# Patient Record
Sex: Female | Born: 1980 | Race: Black or African American | Hispanic: No | Marital: Married | Smoking: Former smoker
Health system: Southern US, Community
[De-identification: ages and names within clinical notes are randomized; demographics above are authoritative.]

## PROBLEM LIST (undated history)

## (undated) DIAGNOSIS — E079 Disorder of thyroid, unspecified: Secondary | ICD-10-CM

## (undated) DIAGNOSIS — E669 Obesity, unspecified: Secondary | ICD-10-CM

## (undated) DIAGNOSIS — I499 Cardiac arrhythmia, unspecified: Secondary | ICD-10-CM

## (undated) HISTORY — PX: BALLOON ANGIOPLASTY, ARTERY: SHX564

## (undated) HISTORY — PX: ANKLE FRACTURE SURGERY: SHX122

---

## 2008-07-22 ENCOUNTER — Ambulatory Visit: Payer: Self-pay | Admitting: Obstetrics & Gynecology

## 2008-07-22 ENCOUNTER — Inpatient Hospital Stay (HOSPITAL_COMMUNITY): Admission: AD | Admit: 2008-07-22 | Discharge: 2008-07-22 | Payer: Self-pay | Admitting: Obstetrics & Gynecology

## 2011-07-20 LAB — DIFFERENTIAL
Band Neutrophils: 7
Blasts: 0
Eosinophils Absolute: 0.1
Metamyelocytes Relative: 0
Myelocytes: 0
nRBC: 0

## 2011-07-20 LAB — CBC
HCT: 35.8 — ABNORMAL LOW
MCHC: 33.8
MCV: 93.7
Platelets: 206
RDW: 13.5

## 2013-10-02 ENCOUNTER — Emergency Department (HOSPITAL_BASED_OUTPATIENT_CLINIC_OR_DEPARTMENT_OTHER): Payer: Self-pay

## 2013-10-02 ENCOUNTER — Emergency Department (HOSPITAL_BASED_OUTPATIENT_CLINIC_OR_DEPARTMENT_OTHER)
Admission: EM | Admit: 2013-10-02 | Discharge: 2013-10-02 | Disposition: A | Discharge status: Home / Self Care | Payer: Self-pay | Attending: Emergency Medicine | Admitting: Emergency Medicine

## 2013-10-02 ENCOUNTER — Encounter (HOSPITAL_BASED_OUTPATIENT_CLINIC_OR_DEPARTMENT_OTHER): Payer: Self-pay | Admitting: Emergency Medicine

## 2013-10-02 DIAGNOSIS — R0602 Shortness of breath: Secondary | ICD-10-CM | POA: Insufficient documentation

## 2013-10-02 DIAGNOSIS — Z8679 Personal history of other diseases of the circulatory system: Secondary | ICD-10-CM | POA: Insufficient documentation

## 2013-10-02 DIAGNOSIS — R002 Palpitations: Secondary | ICD-10-CM | POA: Insufficient documentation

## 2013-10-02 HISTORY — DX: Cardiac arrhythmia, unspecified: I49.9

## 2013-10-02 LAB — CBC WITH DIFFERENTIAL/PLATELET
Basophils Absolute: 0 10*3/uL (ref 0.0–0.1)
Eosinophils Relative: 1 % (ref 0–5)
HCT: 36.2 % (ref 36.0–46.0)
Lymphocytes Relative: 36 % (ref 12–46)
Lymphs Abs: 3.1 10*3/uL (ref 0.7–4.0)
MCV: 86.2 fL (ref 78.0–100.0)
Neutro Abs: 4.8 10*3/uL (ref 1.7–7.7)
Platelets: 307 10*3/uL (ref 150–400)
RBC: 4.2 MIL/uL (ref 3.87–5.11)
WBC: 8.5 10*3/uL (ref 4.0–10.5)

## 2013-10-02 LAB — TROPONIN I: Troponin I: <0.3 ng/mL (ref <0.30)

## 2013-10-02 LAB — BASIC METABOLIC PANEL
CO2: 24 mEq/L (ref 19–32)
Chloride: 104 mEq/L (ref 96–112)
Creatinine, Ser: 0.8 mg/dL (ref 0.50–1.10)
GFR calc Af Amer: >90 mL/min (ref >90)
Potassium: 3.9 mEq/L (ref 3.5–5.1)

## 2013-10-02 NOTE — ED Notes (Signed)
Intermittent mid sternal cp x3 days she describes as "pressure".  Denies radiation. Denies n/v. Denies back pain.

## 2013-10-02 NOTE — ED Notes (Signed)
MD at bedside discussing test results and dispo plan of care. 

## 2013-10-02 NOTE — ED Notes (Signed)
Patient transported to X-ray 

## 2013-10-02 NOTE — ED Notes (Signed)
Chest pressure and pain x days, denies n/v  No radiation,  No change w movement

## 2013-10-02 NOTE — ED Provider Notes (Signed)
CSN: [id number]     Arrival date & time 10/02/13  2059 History  This chart was scribed for Rolan Bucco, MD by Landis Gandy, ED Scribe. This patient was seen in room MH11/MH11 and the patient's care was started at 9:45 PM   Chief Complaint  Patient presents with  . Chest Pain   The history is provided by the patient. No language interpreter was used.   HPI Comments: Candace Cohen is a 32 y.o. female who presents to the Emergency Department complaining of intermittent chest pain, onset three days ago. Pt describes the pain as "pressure" and says she feels like her heart is racing. She states that she has a history of palpitations, that have worsened over the past few days.  Pt states that the episodes usually last about twenty minutes. She reports associated symptoms of SOB.  She has a history of balloon angioplasty surgery at age 34 to correct an irregular heartbeat. She denies any LOC. She denies associated syncope/near syncope with palpitations.    Past Medical History  Diagnosis Date  . Irregular heart beat    Past Surgical History  Procedure Laterality Date  . Balloon angioplasty, artery      At age 62   No family history on file. History  Substance Use Topics  . Smoking status: Former Games developer  . Smokeless tobacco: Not on file  . Alcohol Use: No   OB History   Grav Para Term Preterm Abortions TAB SAB Ect Mult Living                 Review of Systems  Constitutional: Negative for fever, chills, diaphoresis and fatigue.  HENT: Negative for congestion, rhinorrhea and sneezing.   Eyes: Negative.   Respiratory: Positive for shortness of breath. Negative for cough and chest tightness.   Cardiovascular: Positive for chest pain and palpitations. Negative for leg swelling.  Gastrointestinal: Negative for nausea, vomiting, abdominal pain, diarrhea and blood in stool.  Genitourinary: Negative for frequency, hematuria, flank pain and difficulty urinating.  Musculoskeletal:  Negative for arthralgias and back pain.  Skin: Negative for rash.  Neurological: Negative for dizziness (Loss of balance), syncope, speech difficulty, weakness, numbness and headaches.    Allergies  Review of patient's allergies indicates no known allergies.  Home Medications  No current outpatient prescriptions on file. Triage Vitals: BP 139/80  Pulse 74  Temp(Src) 98.4 F (36.9 C) (Oral)  Resp 16  Ht 5' 4.5" (1.638 m)  Wt 226 lb (102.513 kg)  BMI 38.21 kg/m2  SpO2 100% Physical Exam  Constitutional: She is oriented to person, place, and time. She appears well-developed and well-nourished.  HENT:  Head: Normocephalic and atraumatic.  Eyes: Pupils are equal, round, and reactive to light.  Neck: Normal range of motion. Neck supple.  Cardiovascular: Normal rate, regular rhythm and normal heart sounds.   Pulmonary/Chest: Effort normal and breath sounds normal. No respiratory distress. She has no wheezes. She has no rales. She exhibits no tenderness.  Abdominal: Soft. Bowel sounds are normal. There is no tenderness. There is no rebound and no guarding.  Musculoskeletal: Normal range of motion. She exhibits no edema.  No calf tenderness  Lymphadenopathy:    She has no cervical adenopathy.  Neurological: She is alert and oriented to person, place, and time.  Skin: Skin is warm and dry. No rash noted.  Psychiatric: She has a normal mood and affect.    ED Course  Procedures (including critical care time) DIAGNOSTIC STUDIES: Oxygen Saturation is  100% on RA, normal by my interpretation.    COORDINATION OF CARE: 9:50 PM- Discussed plan to obtain CXR, EKG and diagnostic labs. Also discussed options for follow-up with cardiology. Pt advised of plan for treatment and pt agrees.  Labs Review Results for orders placed during the hospital encounter of 10/02/13  CBC WITH DIFFERENTIAL      Result Value Range   WBC 8.5  4.0 - 10.5 K/uL   RBC 4.20  3.87 - 5.11 MIL/uL   Hemoglobin 11.8  (*) 12.0 - 15.0 g/dL   HCT 16.1  09.6 - 04.5 %   MCV 86.2  78.0 - 100.0 fL   MCH 28.1  26.0 - 34.0 pg   MCHC 32.6  30.0 - 36.0 g/dL   RDW 40.9  81.1 - 91.4 %   Platelets 307  150 - 400 K/uL   Neutrophils Relative % 56  43 - 77 %   Neutro Abs 4.8  1.7 - 7.7 K/uL   Lymphocytes Relative 36  12 - 46 %   Lymphs Abs 3.1  0.7 - 4.0 K/uL   Monocytes Relative 7  3 - 12 %   Monocytes Absolute 0.6  0.1 - 1.0 K/uL   Eosinophils Relative 1  0 - 5 %   Eosinophils Absolute 0.1  0.0 - 0.7 K/uL   Basophils Relative 1  0 - 1 %   Basophils Absolute 0.0  0.0 - 0.1 K/uL  TROPONIN I      Result Value Range   Troponin I <0.30  <0.30 ng/mL  BASIC METABOLIC PANEL      Result Value Range   Sodium 139  135 - 145 mEq/L   Potassium 3.9  3.5 - 5.1 mEq/L   Chloride 104  96 - 112 mEq/L   CO2 24  19 - 32 mEq/L   Glucose, Bld 91  70 - 99 mg/dL   BUN 8  6 - 23 mg/dL   Creatinine, Ser 7.82  0.50 - 1.10 mg/dL   Calcium 9.0  8.4 - 95.6 mg/dL   GFR calc non Af Amer >90  >90 mL/min   GFR calc Af Amer >90  >90 mL/min   Dg Chest 2 View  10/02/2013   CLINICAL DATA:  Chest pain and tachycardia  EXAM: CHEST  2 VIEW  COMPARISON:  None.  FINDINGS: The heart size and mediastinal contours are within normal limits. Both lungs are clear. The visualized skeletal structures are unremarkable.  IMPRESSION: No active cardiopulmonary disease.   Electronically Signed   By: Alcide Clever M.D.   On: 10/02/2013 21:57    Imaging Review Dg Chest 2 View  10/02/2013   CLINICAL DATA:  Chest pain and tachycardia  EXAM: CHEST  2 VIEW  COMPARISON:  None.  FINDINGS: The heart size and mediastinal contours are within normal limits. Both lungs are clear. The visualized skeletal structures are unremarkable.  IMPRESSION: No active cardiopulmonary disease.   Electronically Signed   By: Alcide Clever M.D.   On: 10/02/2013 21:57    EKG Interpretation    Date/Time:  Tuesday October 02 2013 21:08:06 EST Ventricular Rate:  75 PR Interval:  138 QRS  Duration: 66 QT Interval:  366 QTC Calculation: 408 R Axis:   64 Text Interpretation:  Normal sinus rhythm Normal ECG No old tracing to compare Confirmed by Kalmen Lollar  MD, Tiffane Sheldon ([id number]) on 10/02/2013 10:13:09 PM            MDM   1. Palpitations  Pt with palpitations, associated with chest pressure and SOB.  Has been having palpitations for several months, but more frequent last few days.  No CP without palpitations.  EKG ok.  Will refer for close outpatient f/u with cardiology  I personally performed the services described in this documentation, which was scribed in my presence.  The recorded information has been reviewed and considered.     Rolan Bucco, MD 10/02/13 2213

## 2015-08-01 ENCOUNTER — Encounter (HOSPITAL_BASED_OUTPATIENT_CLINIC_OR_DEPARTMENT_OTHER): Payer: Self-pay | Admitting: Emergency Medicine

## 2015-08-01 ENCOUNTER — Emergency Department (HOSPITAL_BASED_OUTPATIENT_CLINIC_OR_DEPARTMENT_OTHER)
Admission: EM | Admit: 2015-08-01 | Discharge: 2015-08-01 | Disposition: A | Discharge status: Home / Self Care | Payer: Self-pay | Attending: Emergency Medicine | Admitting: Emergency Medicine

## 2015-08-01 ENCOUNTER — Emergency Department (HOSPITAL_BASED_OUTPATIENT_CLINIC_OR_DEPARTMENT_OTHER): Payer: Self-pay

## 2015-08-01 DIAGNOSIS — E669 Obesity, unspecified: Secondary | ICD-10-CM | POA: Insufficient documentation

## 2015-08-01 DIAGNOSIS — Z87891 Personal history of nicotine dependence: Secondary | ICD-10-CM | POA: Insufficient documentation

## 2015-08-01 DIAGNOSIS — Z8679 Personal history of other diseases of the circulatory system: Secondary | ICD-10-CM | POA: Insufficient documentation

## 2015-08-01 DIAGNOSIS — M25571 Pain in right ankle and joints of right foot: Secondary | ICD-10-CM | POA: Insufficient documentation

## 2015-08-01 HISTORY — DX: Obesity, unspecified: E66.9

## 2015-08-01 MED ORDER — DEXAMETHASONE 4 MG PO TABS: 4.0000 mg | ORAL_TABLET | Freq: Two times a day (BID) | ORAL | Status: AC

## 2015-08-01 MED ORDER — PREDNISONE 50 MG PO TABS
60.0000 mg | ORAL_TABLET | Freq: Once | ORAL | Status: AC
  Administered 2015-08-01: 60 mg via ORAL
  Filled 2015-08-01 (×2): qty 1

## 2015-08-01 MED ORDER — KETOROLAC TROMETHAMINE 10 MG PO TABS
10.0000 mg | ORAL_TABLET | Freq: Once | ORAL | Status: AC
  Administered 2015-08-01: 10 mg via ORAL
  Filled 2015-08-01: qty 1

## 2015-08-01 MED ORDER — MELOXICAM 7.5 MG PO TABS: 7.5000 mg | ORAL_TABLET | Freq: Two times a day (BID) | ORAL | Status: AC

## 2015-08-01 NOTE — ED Notes (Signed)
Pain in right ankle x6 months.  Has not seen pmd. Walks with brisk gait.

## 2015-08-01 NOTE — Discharge Instructions (Signed)
Your x-ray is negative for fracture or dislocation. Your examination favors inflammation involving the right ankle. Please see Dr. Magnus IvanBlackman or the orthopedic specialist of your choice for evaluation concerning your ankle. Continue to use your compression sock. Use of mobile 2 times daily, and Decadron 2 times daily with food. Ankle Pain Ankle pain is a common symptom. The bones, cartilage, tendons, and muscles of the ankle joint perform a lot of work each day. The ankle joint holds your body weight and allows you to move around. Ankle pain can occur on either side or back of 1 or both ankles. Ankle pain may be sharp and burning or dull and aching. There may be tenderness, stiffness, redness, or warmth around the ankle. The pain occurs more often when a person walks or puts pressure on the ankle. CAUSES  There are many reasons ankle pain can develop. It is important to work with your caregiver to identify the cause since many conditions can impact the bones, cartilage, muscles, and tendons. Causes for ankle pain include:  Injury, including a break (fracture), sprain, or strain often due to a fall, sports, or a high-impact activity.  Swelling (inflammation) of a tendon (tendonitis).  Achilles tendon rupture.  Ankle instability after repeated sprains and strains.  Poor foot alignment.  Pressure on a nerve (tarsal tunnel syndrome).  Arthritis in the ankle or the lining of the ankle.  Crystal formation in the ankle (gout or pseudogout). DIAGNOSIS  A diagnosis is based on your medical history, your symptoms, results of your physical exam, and results of diagnostic tests. Diagnostic tests may include X-ray exams or a computerized magnetic scan (magnetic resonance imaging, MRI). TREATMENT  Treatment will depend on the cause of your ankle pain and may include:  Keeping pressure off the ankle and limiting activities.  Using crutches or other walking support (a cane or brace).  Using rest, ice,  compression, and elevation.  Participating in physical therapy or home exercises.  Wearing shoe inserts or special shoes.  Losing weight.  Taking medications to reduce pain or swelling or receiving an injection.  Undergoing surgery. HOME CARE INSTRUCTIONS   Only take over-the-counter or prescription medicines for pain, discomfort, or fever as directed by your caregiver.  Put ice on the injured area.  Put ice in a plastic bag.  Place a towel between your skin and the bag.  Leave the ice on for 15-20 minutes at a time, 03-04 times a day.  Keep your leg raised (elevated) when possible to lessen swelling.  Avoid activities that cause ankle pain.  Follow specific exercises as directed by your caregiver.  Record how often you have ankle pain, the location of the pain, and what it feels like. This information may be helpful to you and your caregiver.  Ask your caregiver about returning to work or sports and whether you should drive.  Follow up with your caregiver for further examination, therapy, or testing as directed. SEEK MEDICAL CARE IF:   Pain or swelling continues or worsens beyond 1 week.  You have an oral temperature above 102 F (38.9 C).  You are feeling unwell or have chills.  You are having an increasingly difficult time with walking.  You have loss of sensation or other new symptoms.  You have questions or concerns. MAKE SURE YOU:   Understand these instructions.  Will watch your condition.  Will get help right away if you are not doing well or get worse.   This information is not intended  to replace advice given to you by your health care provider. Make sure you discuss any questions you have with your health care provider.   Document Released: 03/24/2010 Document Revised: 12/27/2011 Document Reviewed: 05/06/2015 Elsevier Interactive Patient Education [employer].

## 2015-08-01 NOTE — ED Provider Notes (Signed)
CSN: [id number]     Arrival date & time 08/01/15  1949 History   First MD Initiated Contact with Patient 08/01/15 1957     Chief Complaint  Patient presents with  . Ankle Pain     (Consider location/radiation/quality/duration/timing/severity/associated sxs/prior Treatment) Patient is a 34 y.o. female presenting with ankle pain. The history is provided by the patient.  Ankle Pain Location:  Ankle Injury: no   Ankle location:  R ankle Pain details:    Quality:  Aching and shooting   Severity:  Moderate   Onset quality:  Gradual   Duration:  6 months   Timing:  Intermittent   Progression:  Worsening Chronicity:  Chronic Prior injury to area:  Yes Relieved by:  Nothing Worsened by:  Bearing weight Ineffective treatments:  Acetaminophen Associated symptoms: no fever, no numbness and no tingling   Risk factors: no known bone disorder     Past Medical History  Diagnosis Date  . Irregular heart beat   . Obesity    Past Surgical History  Procedure Laterality Date  . Balloon angioplasty, artery      At age 89  . Ankle fracture surgery     No family history on file. Social History  Substance Use Topics  . Smoking status: Former Games developermoker  . Smokeless tobacco: None  . Alcohol Use: No   OB History    No data available     Review of Systems  Constitutional: Negative for fever.  Musculoskeletal: Positive for arthralgias.  All other systems reviewed and are negative.     Allergies  Review of patient's allergies indicates no known allergies.  Home Medications   Prior to Admission medications   Medication Sig Start Date End Date Taking? Authorizing Provider  dexamethasone (DECADRON) 4 MG tablet Take 1 tablet (4 mg total) by mouth 2 (two) times daily with a meal. 08/01/15   Ivery QualeHobson Marge Vandermeulen, PA-C  meloxicam (MOBIC) 7.5 MG tablet Take 1 tablet (7.5 mg total) by mouth 2 (two) times daily. 08/01/15   Ivery QualeHobson Marytza Grandpre, PA-C   BP 130/83 mmHg  Pulse 70  Temp(Src) 98.5 F (36.9  C) (Oral)  Resp 16  Ht 5\' 5"  (1.651 m)  Wt 223 lb (101.152 kg)  BMI 37.11 kg/m2  SpO2 100% Physical Exam  Constitutional: She is oriented to person, place, and time. She appears well-developed and well-nourished.  Non-toxic appearance.  HENT:  Head: Normocephalic.  Right Ear: Tympanic membrane and external ear normal.  Left Ear: Tympanic membrane and external ear normal.  Eyes: EOM and lids are normal. Pupils are equal, round, and reactive to light.  Neck: Normal range of motion. Neck supple. Carotid bruit is not present.  Cardiovascular: Normal rate, regular rhythm, normal heart sounds, intact distal pulses and normal pulses.   Pulmonary/Chest: Breath sounds normal. No respiratory distress.  Abdominal: Soft. Bowel sounds are normal. There is no tenderness. There is no guarding.  Musculoskeletal: Normal range of motion.  There is puffiness and tenderness of the lateral malleolus on the right. The dorsalis pedis and posterior tibial pulses are 2+. The Achilles tendon is intact. There no lesions between the toes. There no puncture wounds of the plantar surface of the right foot. There is full range of motion of the toes, knee, and hip of the right lower extremity. There no temperature changes or discolorations appreciated.  Lymphadenopathy:       Head (right side): No submandibular adenopathy present.       Head (left side): No  submandibular adenopathy present.    She has no cervical adenopathy.  Neurological: She is alert and oriented to person, place, and time. She has normal strength. No cranial nerve deficit or sensory deficit.  Skin: Skin is warm and dry.  Psychiatric: She has a normal mood and affect. Her speech is normal.  Nursing note and vitals reviewed.   ED Course  Procedures (including critical care time) Labs Review Labs Reviewed - No data to display  Imaging Review Dg Ankle Complete Right  08/01/2015  CLINICAL DATA:  Lateral ankle pain for 1 year without trauma.  EXAM: RIGHT ANKLE - COMPLETE 3+ VIEW COMPARISON:  None. FINDINGS: No acute fracture or dislocation. Base of fifth metatarsal and talar dome intact. Mild pes planus deformity. Tiny calcaneal spur. IMPRESSION: No acute osseous abnormality. Electronically Signed   By: Jeronimo Greaves M.D.   On: 08/01/2015 20:58   I have personally reviewed and evaluated these images and lab results as part of my medical decision-making.   EKG Interpretation None      MDM  Vital signs stable.  Exam suggest chronic ankle pain. No acute changes noted on exam. Mild degenerative changes on xray, but no fx or dislocation. Pt to follow up with podiatry to complete the work up and for management. Rx for decadron and mobic given to the patient.   Final diagnoses:  Ankle pain, right    *I have reviewed nursing notes, vital signs, and all appropriate lab and imaging results for this patient.[address], PA-C 08/05/15 2008  Rolan Bucco, MD 08/06/15 2121

## 2016-09-21 ENCOUNTER — Emergency Department (HOSPITAL_BASED_OUTPATIENT_CLINIC_OR_DEPARTMENT_OTHER)
Admission: EM | Admit: 2016-09-21 | Discharge: 2016-09-22 | Disposition: A | Discharge status: Home / Self Care | Payer: Medicaid Other | Attending: Emergency Medicine | Admitting: Emergency Medicine

## 2016-09-21 ENCOUNTER — Encounter (HOSPITAL_BASED_OUTPATIENT_CLINIC_OR_DEPARTMENT_OTHER): Payer: Self-pay | Admitting: Emergency Medicine

## 2016-09-21 DIAGNOSIS — M25571 Pain in right ankle and joints of right foot: Secondary | ICD-10-CM | POA: Diagnosis present

## 2016-09-21 DIAGNOSIS — M722 Plantar fascial fibromatosis: Secondary | ICD-10-CM | POA: Insufficient documentation

## 2016-09-21 DIAGNOSIS — Z87891 Personal history of nicotine dependence: Secondary | ICD-10-CM | POA: Diagnosis not present

## 2016-09-21 NOTE — ED Triage Notes (Signed)
Patient states that she has had pain and swelling to her right ankle x 3 weeks. Reports no difference tonight except the pain is getting worse

## 2016-09-22 MED ORDER — IBUPROFEN 400 MG PO TABS
600.0000 mg | ORAL_TABLET | Freq: Once | ORAL | Status: AC
  Administered 2016-09-22: 600 mg via ORAL
  Filled 2016-09-22: qty 1

## 2016-09-22 NOTE — ED Provider Notes (Signed)
[facility]   CSN: [id number] Arrival date & time: 09/21/16  2337     History   Chief Complaint Chief Complaint  Patient presents with  . Ankle Pain    HPI Candace Cohen is a 35 y.o. female. With no sig PMH, here with ankle pain x 6 months.  She states the swelling has been there but the pain has worsened.  She denies any trauma to the area.  Swelling is diffuse over the R ankle, but pain is on the medial aspect and runs into the plantar surface.  She works at [employer] and is on her feet for 12 hours a day.  She states her shoes do not have the best support and she does not have insoles.   She is requesting something to help with pain and swelling.  There are no further complaint and she states her other ankle and foot are normal.  10 Systems reviewed and are negative for acute change except as noted in the HPI.  HPI  Past Medical History:  Diagnosis Date  . Irregular heart beat   . Obesity     There are no active problems to display for this patient.   Past Surgical History:  Procedure Laterality Date  . ANKLE FRACTURE SURGERY    . BALLOON ANGIOPLASTY, ARTERY     At age 35    OB History    No data available       Home Medications    Prior to Admission medications   Medication Sig Start Date End Date Taking? Authorizing Provider  dexamethasone (DECADRON) 4 MG tablet Take 1 tablet (4 mg total) by mouth 2 (two) times daily with a meal. 08/01/15   Ivery QualeHobson Bryant, PA-C  meloxicam (MOBIC) 7.5 MG tablet Take 1 tablet (7.5 mg total) by mouth 2 (two) times daily. 08/01/15   Ivery QualeHobson Bryant, PA-C    Family History History reviewed. No pertinent family history.  Social History Social History  Substance Use Topics  . Smoking status: Former Games developermoker  . Smokeless tobacco: Never Used  . Alcohol use No     Allergies   Patient has no known allergies.   Review of Systems Review of Systems   Physical Exam Updated Vital Signs BP 119/78    Pulse 72   Temp 98.5 F (36.9 C) (Oral)   Resp 18   Ht 5\' 5"  (1.651 m)   Wt 220 lb (99.8 kg)   LMP 09/21/2016   SpO2 97%   BMI 36.61 kg/m   Physical Exam  Constitutional: She is oriented to person, place, and time. She appears well-developed and well-nourished. No distress.  HENT:  Head: Normocephalic and atraumatic.  Nose: Nose normal.  Mouth/Throat: Oropharynx is clear and moist. No oropharyngeal exudate.  Eyes: Conjunctivae and EOM are normal. Pupils are equal, round, and reactive to light. No scleral icterus.  Neck: Normal range of motion. Neck supple. No JVD present. No tracheal deviation present. No thyromegaly present.  Cardiovascular: Normal rate, regular rhythm and normal heart sounds.  Exam reveals no gallop and no friction rub.   No murmur heard. Pulmonary/Chest: Effort normal and breath sounds normal. No respiratory distress. She has no wheezes. She exhibits no tenderness.  Abdominal: Soft. Bowel sounds are normal. She exhibits no distension and no mass. There is no tenderness. There is no rebound and no guarding.  Musculoskeletal: Normal range of motion. She exhibits no edema or tenderness.  Minimal swelling seen to right ankle.  There is  TTP of the medial aspect and plantar surface.  No erythema or warmth.  Lymphadenopathy:    She has no cervical adenopathy.  Neurological: She is alert and oriented to person, place, and time. No cranial nerve deficit. She exhibits normal muscle tone.  Skin: Skin is warm and dry. No rash noted. No erythema. No pallor.  Nursing note and vitals reviewed.    ED Treatments / Results  Labs (all labs ordered are listed, but only abnormal results are displayed) Labs Reviewed - No data to display  EKG  EKG Interpretation None       Radiology No results found.  Procedures Procedures (including critical care time)  Medications Ordered in ED Medications  ibuprofen (ADVIL,MOTRIN) tablet 600 mg (600 mg Oral Given 09/22/16 0112)      Initial Impression / Assessment and Plan / ED Course  I have reviewed the triage vital signs and the nursing notes.  Pertinent labs & imaging results that were available during my care of the patient were reviewed by me and considered in my medical decision making (see chart for details).  Clinical Course    Patient presents to the ED for foot and ankle pain.  Her exam is consistent with plantar fascitis.  Education and preventative measures were provided.  She was given postop shoe to see if a hard surface may help.  Advised to get insoles or new shoes as well.  Ibuprofen for pain control. PCP fu advised or podiatry.  She appears well and in NAD. VS remain within her normal limits and she is safe for DC.  Final Clinical Impressions(s) / ED Diagnoses   Final diagnoses:  Plantar fasciitis    New Prescriptions New Prescriptions   No medications on file     Tomasita CrumbleAdeleke Marquelle Balow, MD 09/22/16 0117

## 2017-06-19 IMAGING — DX DG ANKLE COMPLETE 3+V*R*
3 series · 3 of 3 positions shown · non-contrast
Comparison: None.

CLINICAL DATA: Lateral ankle pain for 1 year without trauma.

EXAM:
RIGHT ANKLE - COMPLETE 3+ VIEW

[ankle ap]
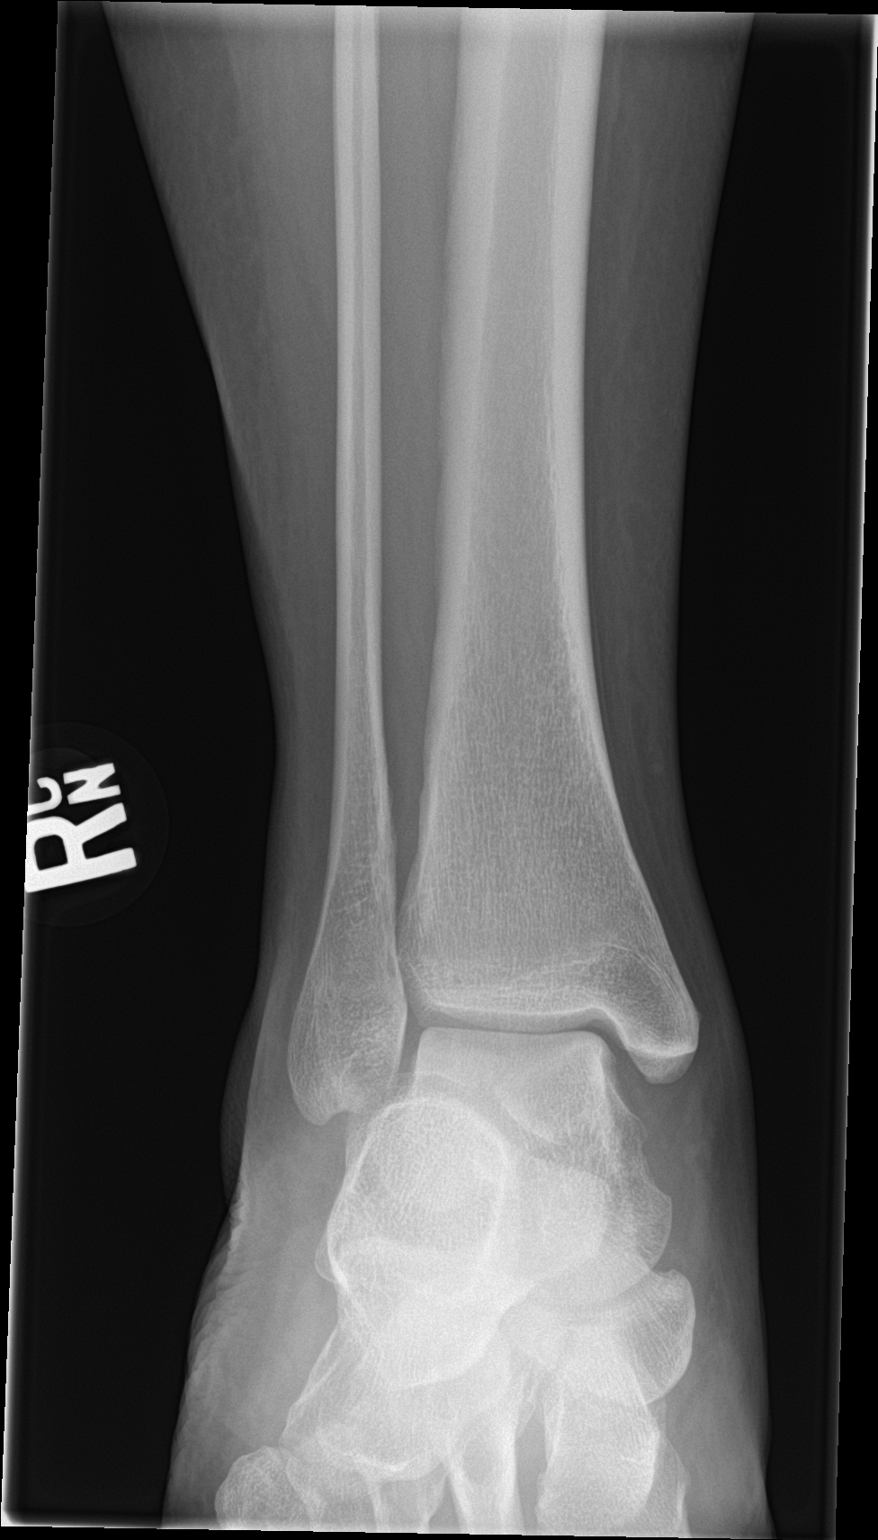

[ankle obl]
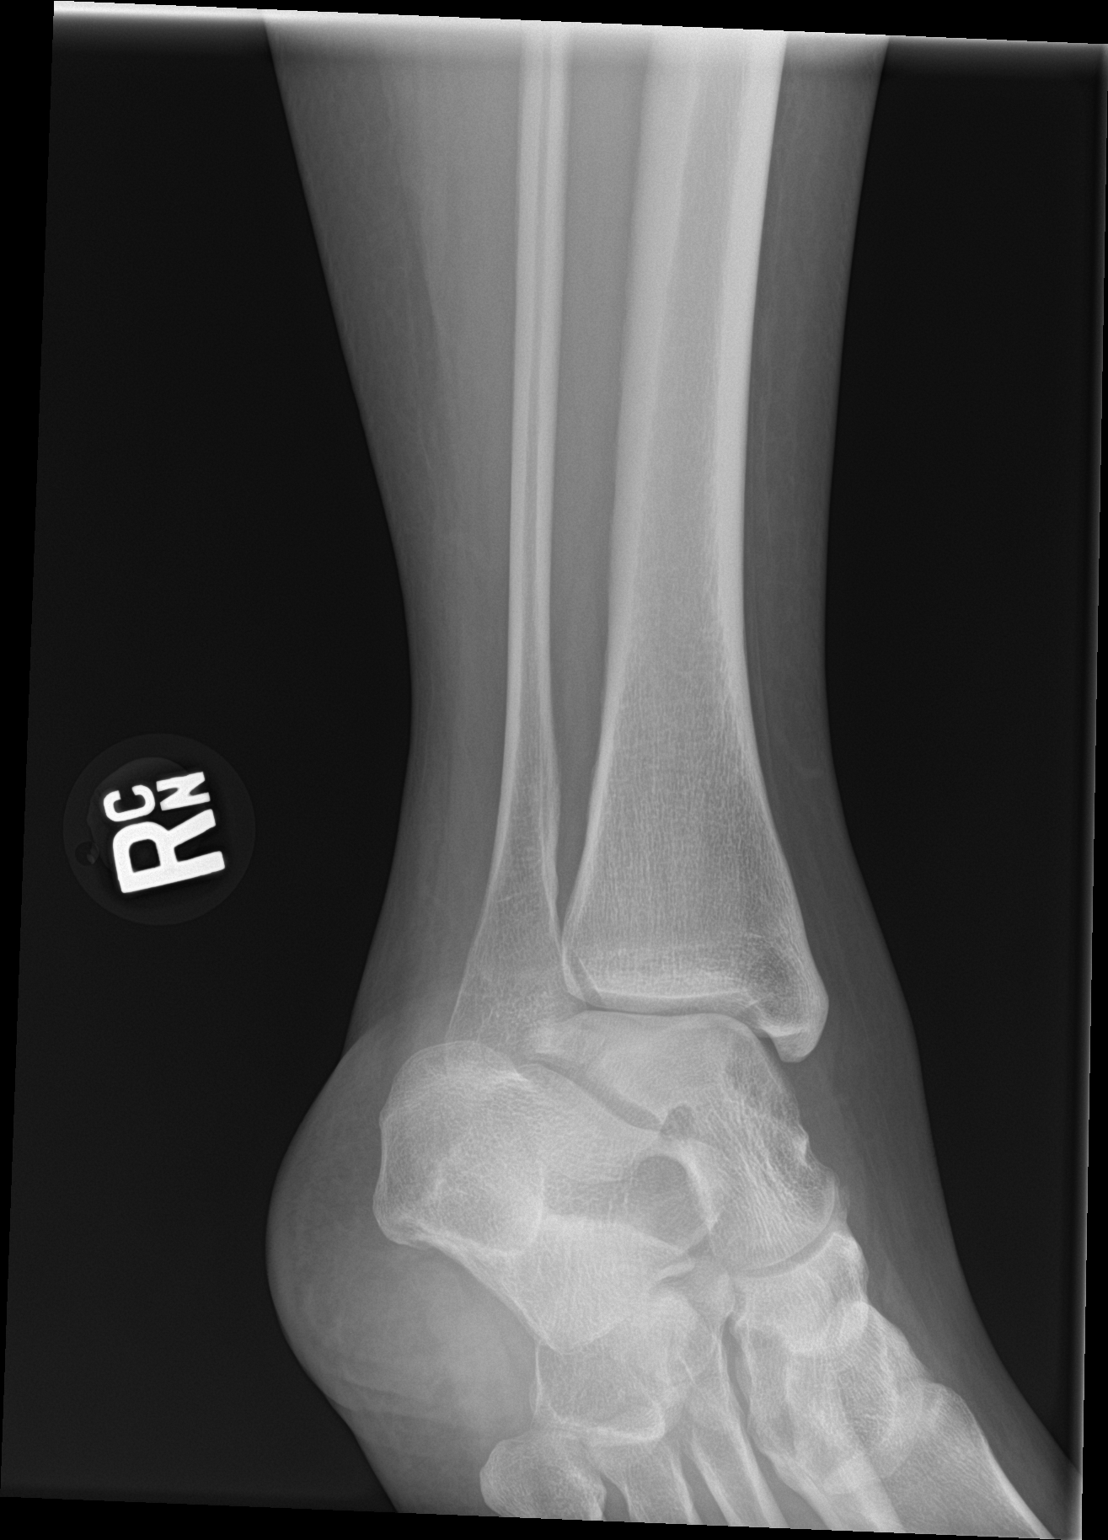

[ankle lat]
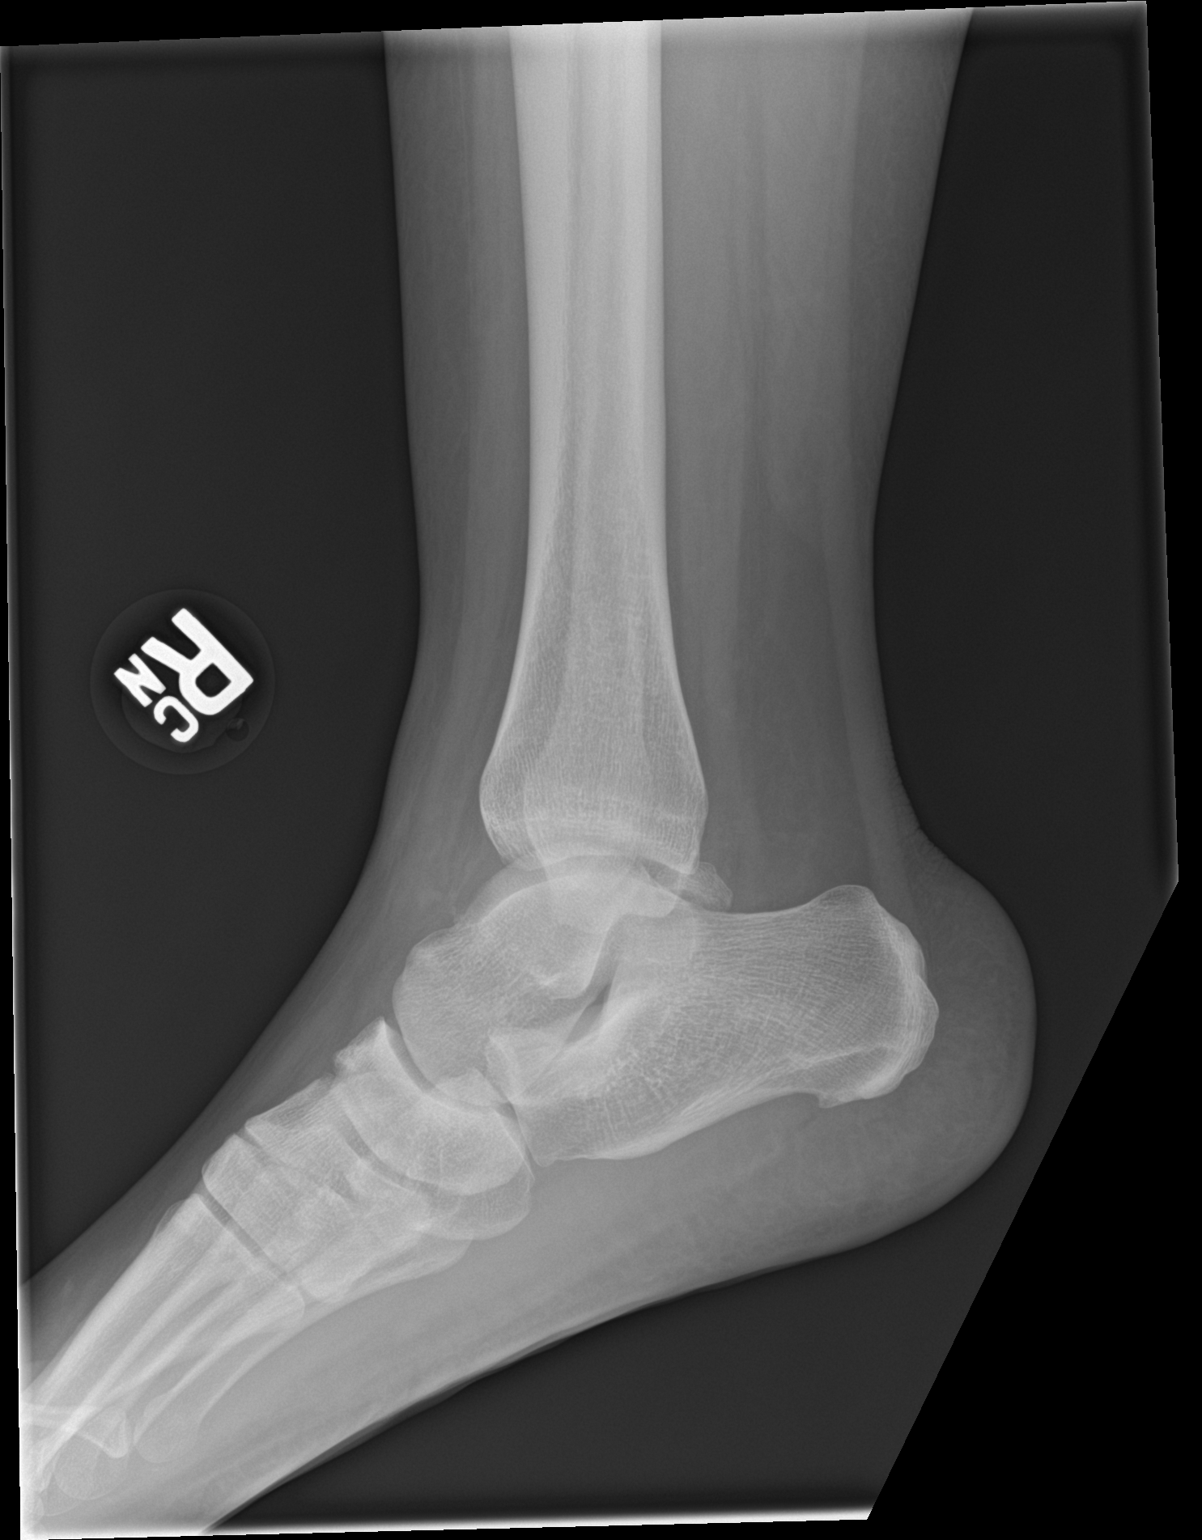

[3 of 3 positions shown; findings below may reference images not displayed]

FINDINGS: No acute fracture or dislocation. Base of fifth metatarsal and talar
dome intact. Mild pes planus deformity. Tiny calcaneal spur.
IMPRESSION: No acute osseous abnormality.

## 2019-03-23 ENCOUNTER — Other Ambulatory Visit: Payer: Self-pay

## 2019-03-23 ENCOUNTER — Encounter (HOSPITAL_BASED_OUTPATIENT_CLINIC_OR_DEPARTMENT_OTHER): Payer: Self-pay | Admitting: Emergency Medicine

## 2019-03-23 ENCOUNTER — Emergency Department (HOSPITAL_BASED_OUTPATIENT_CLINIC_OR_DEPARTMENT_OTHER)
Admission: EM | Admit: 2019-03-23 | Discharge: 2019-03-23 | Disposition: A | Discharge status: Home / Self Care | Payer: Self-pay | Attending: Emergency Medicine | Admitting: Emergency Medicine

## 2019-03-23 ENCOUNTER — Emergency Department (HOSPITAL_BASED_OUTPATIENT_CLINIC_OR_DEPARTMENT_OTHER): Payer: Self-pay

## 2019-03-23 DIAGNOSIS — Z3A01 Less than 8 weeks gestation of pregnancy: Secondary | ICD-10-CM | POA: Insufficient documentation

## 2019-03-23 DIAGNOSIS — Z87891 Personal history of nicotine dependence: Secondary | ICD-10-CM | POA: Insufficient documentation

## 2019-03-23 DIAGNOSIS — Z79899 Other long term (current) drug therapy: Secondary | ICD-10-CM | POA: Insufficient documentation

## 2019-03-23 DIAGNOSIS — O200 Threatened abortion: Secondary | ICD-10-CM | POA: Insufficient documentation

## 2019-03-23 HISTORY — DX: Disorder of thyroid, unspecified: E07.9

## 2019-03-23 LAB — CBC
HCT: 39.4 % (ref 36.0–46.0)
Hemoglobin: 12.3 g/dL (ref 12.0–15.0)
MCH: 29.6 pg (ref 26.0–34.0)
MCHC: 31.2 g/dL (ref 30.0–36.0)
MCV: 94.9 fL (ref 80.0–100.0)
Platelets: 264 10*3/uL (ref 150–400)
RBC: 4.15 MIL/uL (ref 3.87–5.11)
RDW: 13.7 % (ref 11.5–15.5)
WBC: 6.6 10*3/uL (ref 4.0–10.5)
nRBC: 0 % (ref 0.0–0.2)

## 2019-03-23 LAB — BASIC METABOLIC PANEL
Anion gap: 7 (ref 5–15)
BUN: 7 mg/dL (ref 6–20)
CO2: 24 mmol/L (ref 22–32)
Calcium: 8.2 mg/dL — ABNORMAL LOW (ref 8.9–10.3)
Chloride: 105 mmol/L (ref 98–111)
Creatinine, Ser: 0.7 mg/dL (ref 0.44–1.00)
GFR calc Af Amer: >60 mL/min (ref >60)
GFR calc non Af Amer: >60 mL/min (ref >60)
Glucose, Bld: 130 mg/dL — ABNORMAL HIGH (ref 70–99)
Potassium: 3.9 mmol/L (ref 3.5–5.1)
Sodium: 136 mmol/L (ref 135–145)

## 2019-03-23 LAB — HCG, QUANTITATIVE, PREGNANCY: hCG, Beta Chain, Quant, S: 3647 m[IU]/mL — ABNORMAL HIGH (ref <5)

## 2019-03-23 NOTE — Discharge Instructions (Signed)
Your hCG level was 3647.  The ultrasound showed a gestational sac but no fetus or yolk sac.  It is possible you are in the early stages of pregnancy however an early miscarriage is also a concern.  Please follow-up with your OB/GYN doctor to have your hCG level rechecked as we discussed.  Typically this should double every 48 hours early stages of pregnancy.  A repeat ultrasound as directed by your OB/GYN doctor it would also be helpful.

## 2019-03-23 NOTE — ED Triage Notes (Signed)
Vaginal bleeding and abd cramping since early morning. She is [redacted] weeks pregnant.

## 2019-03-23 NOTE — ED Provider Notes (Signed)
[facility] EMERGENCY DEPARTMENT Provider Note   CSN: [id number] Arrival date & time: 03/23/19  1140    History   Chief Complaint Chief Complaint  Patient presents with   Vaginal Bleeding    HPI Candace Cohen is a 38 y.o. female.     HPI Patient presents emergency room for evaluation of vaginal bleeding.  Patient is G3, P2 at approximately 6 weeks estimated gestational age.  Patient states she started noticing some spotting yesterday and this morning she noticed more bright red blood when she went to the bathroom.  Patient was understandably concerned about her pregnancy so she came to the ED for evaluation.  She has not had any prenatal care yet but she does have an appointment scheduled this month.  Patient denies any abdominal pain.  No fevers or chills.  She has 2 healthy children at home. Past Medical History:  Diagnosis Date   Irregular heart beat    Obesity    Thyroid disease     There are no active problems to display for this patient.   Past Surgical History:  Procedure Laterality Date   ANKLE FRACTURE SURGERY Left    BALLOON ANGIOPLASTY, ARTERY      Age 14-   Coronary Artery per patient     OB History    Gravida  3   Para  2   Term  2   Preterm  0   AB  0   Living  2     SAB  0   TAB  0   Ectopic  0   Multiple  0   Live Births  0        Obstetric Comments  Vaginal delivery x 2 Hx preterm labor @34  weeks, delivered @ 37 weeks         Home Medications    Prior to Admission medications   Medication Sig Start Date End Date Taking? Authorizing Provider  dexamethasone (DECADRON) 4 MG tablet Take 1 tablet (4 mg total) by mouth 2 (two) times daily with a meal. 08/01/15   Ivery Quale, PA-C  meloxicam (MOBIC) 7.5 MG tablet Take 1 tablet (7.5 mg total) by mouth 2 (two) times daily. 08/01/15   Ivery Quale, PA-C    Family History No family history on file.  Social History Social History   Tobacco Use    Smoking status: Former Smoker   Smokeless tobacco: Never Used  Substance Use Topics   Alcohol use: No   Drug use: No     Allergies   Patient has no known allergies.   Review of Systems Review of Systems  All other systems reviewed and are negative.    Physical Exam Updated Vital Signs BP 136/77 (BP Location: Right Arm)    Pulse 85    Temp 98.3 F (36.8 C) (Oral)    Resp 16    Ht 1.626 m (5\' 4" )    Wt 111.1 kg    LMP 02/09/2019 (Exact Date) Comment: [facility]/14/2019   SpO2 98%    BMI 42.05 kg/m   Physical Exam Vitals signs and nursing note reviewed.  Constitutional:      General: She is not in acute distress.    Appearance: She is well-developed.  HENT:     Head: Normocephalic and atraumatic.     Right Ear: External ear normal.     Left Ear: External ear normal.  Eyes:     General: No scleral  icterus.       Right eye: No discharge.        Left eye: No discharge.     Conjunctiva/sclera: Conjunctivae normal.  Neck:     Musculoskeletal: Neck supple.     Trachea: No tracheal deviation.  Cardiovascular:     Rate and Rhythm: Normal rate and regular rhythm.  Pulmonary:     Effort: Pulmonary effort is normal. No respiratory distress.     Breath sounds: Normal breath sounds. No stridor. No wheezing or rales.  Abdominal:     General: Bowel sounds are normal. There is no distension.     Palpations: Abdomen is soft.     Tenderness: There is no abdominal tenderness. There is no guarding or rebound.  Genitourinary:    Exam position: Supine.     Labia:        Right: No rash, tenderness, lesion or injury.        Left: No rash, tenderness, lesion or injury.      Vagina: No signs of injury and foreign body. Bleeding present. No vaginal discharge, erythema or tenderness.     Cervix: No cervical motion tenderness, discharge or friability.     Uterus: Not tender.      Adnexa:        Right: No mass, tenderness or fullness.         Left: No  mass, tenderness or fullness.       Comments: Blood in the vaginal vault, no active bleeding noted from the cervical loss, cervix is closed, no tissue or products of conception noted in the vaginal vault Musculoskeletal:        General: No tenderness.  Skin:    General: Skin is warm and dry.     Findings: No rash.  Neurological:     Mental Status: She is alert.     Cranial Nerves: No cranial nerve deficit (no facial droop, extraocular movements intact, no slurred speech).     Sensory: No sensory deficit.     Motor: No abnormal muscle tone or seizure activity.     Coordination: Coordination normal.      ED Treatments / Results  Labs (all labs ordered are listed, but only abnormal results are displayed) Labs Reviewed  BASIC METABOLIC PANEL - Abnormal; Notable for the following components:      Result Value   Glucose, Bld 130 (*)    Calcium 8.2 (*)    All other components within normal limits  HCG, QUANTITATIVE, PREGNANCY - Abnormal; Notable for the following components:   hCG, Beta Chain, Quant, S 3,647 (*)    All other components within normal limits  CBC  GC/CHLAMYDIA PROBE AMP ([place]) NOT AT [place] [facility]    Radiology US Ob Comp < 14 Wks  Result Date: 03/23/2019 CLINICAL DATA:  Vaginal bleeding. Six weeks and 0 days pregnant by last menstrual period. EXAM: OBSTETRIC <14 WK [place] AND TRANSVAGINAL OB US TECHNIQUE: Both transabdominal and transvaginal ultrasound examinations were performed for complete evaluation of the gestation as well as the maternal uterus, adnexal regions, and pelvic cul-de-sac. Transvaginal technique was performed to assess early pregnancy. COMPARISON:  None. FINDINGS: Intrauterine gestational sac: Visualized Yolk sac:  Not visualized Embryo:  Not visualized Cardiac Activity: MSD: 5.7 mm   5 w   2 d Subchorionic hemorrhage: Questionable minimal subchorionic hemorrhage. Maternal uterus/adnexae: Normal appearing maternal ovaries. No abnormal free peritoneal fluid.  IMPRESSION: 1. Small intrauterine gestational sac with no yolk sac, fetal pole, or cardiac  activity yet visualized. Recommend follow-up quantitative B-HCG levels and follow-up US in 14 days to assess viability. This recommendation follows SRU consensus guidelines: Diagnostic Criteria for Nonviable Pregnancy Early in the First Trimester. Malva Limes Engl J Med 2013; 161:0960-45; 369:1443-51. 2. Questionable minimal subchorionic hemorrhage. Electronically Signed   By: Beckie SaltsSteven  Reid M.D.   On: 03/23/2019 13:26   [place] Ob Transvaginal  Result Date: 03/23/2019 CLINICAL DATA:  Vaginal bleeding. Six weeks and 0 days pregnant by last menstrual period. EXAM: OBSTETRIC <14 WK US AND TRANSVAGINAL OB US TECHNIQUE: Both transabdominal and transvaginal ultrasound examinations were performed for complete evaluation of the gestation as well as the maternal uterus, adnexal regions, and pelvic cul-de-sac. Transvaginal technique was performed to assess early pregnancy. COMPARISON:  None. FINDINGS: Intrauterine gestational sac: Visualized Yolk sac:  Not visualized Embryo:  Not visualized Cardiac Activity: MSD: 5.7 mm   5 w   2 d Subchorionic hemorrhage: Questionable minimal subchorionic hemorrhage. Maternal uterus/adnexae: Normal appearing maternal ovaries. No abnormal free peritoneal fluid. IMPRESSION: 1. Small intrauterine gestational sac with no yolk sac, fetal pole, or cardiac activity yet visualized. Recommend follow-up quantitative B-HCG levels and follow-up US in 14 days to assess viability. This recommendation follows SRU consensus guidelines: Diagnostic Criteria for Nonviable Pregnancy Early in the First Trimester. Malva Limes Engl J Med 2013; 409:8119-14; 369:1443-51. 2. Questionable minimal subchorionic hemorrhage. Electronically Signed   By: Beckie SaltsSteven  Reid M.D.   On: 03/23/2019 13:26    Procedures Procedures (including critical care time)  Medications Ordered in ED Medications - No data to display   Initial Impression / Assessment and Plan / ED Course  I have  reviewed the triage vital signs and the nursing notes.  Pertinent labs & imaging results that were available during my care of the patient were reviewed by me and considered in my medical decision making (see chart for details).  Clinical Course as of Mar 23 1447  Fri Mar 23, 2019  1435 HCG, Newman NickelsBeta Chain, Quant, S(!): [phone] [JK]    Clinical Course User Index [JK] Linwood DibblesKnapp, Terrick Allred, MD     Patient presented with complaints of vaginal bleeding.  Patient thought she was approximately 6 weeks estimated gestational age.  The ultrasound findings are concerning and that there is no fetus or yolk sac noted.  However it is possible the patient is earlier than 6 weeks and is in the early stages of pregnancy.  Discussed the importance of close follow-up with her OB/GYN doctor to proceed with   Serial hCG and ultrasounds Final Clinical Impressions(s) / ED Diagnoses   Final diagnoses:  Threatened miscarriage in early pregnancy    ED Discharge Orders    None       Linwood DibblesKnapp, Alynah Schone, MD 03/23/19 1448

## 2019-03-26 LAB — GC/CHLAMYDIA PROBE AMP (~~LOC~~) NOT AT ARMC
Chlamydia: NEGATIVE
Neisseria Gonorrhea: NEGATIVE

## 2020-01-07 ENCOUNTER — Encounter (HOSPITAL_BASED_OUTPATIENT_CLINIC_OR_DEPARTMENT_OTHER): Payer: Self-pay

## 2020-01-07 ENCOUNTER — Other Ambulatory Visit: Payer: Self-pay

## 2020-01-07 ENCOUNTER — Emergency Department (HOSPITAL_BASED_OUTPATIENT_CLINIC_OR_DEPARTMENT_OTHER)
Admission: EM | Admit: 2020-01-07 | Discharge: 2020-01-07 | Disposition: A | Discharge status: Home / Self Care | Payer: Medicaid Other | Attending: Emergency Medicine | Admitting: Emergency Medicine

## 2020-01-07 DIAGNOSIS — N898 Other specified noninflammatory disorders of vagina: Secondary | ICD-10-CM | POA: Diagnosis not present

## 2020-01-07 DIAGNOSIS — Z3A24 24 weeks gestation of pregnancy: Secondary | ICD-10-CM | POA: Insufficient documentation

## 2020-01-07 DIAGNOSIS — O3462 Maternal care for abnormality of vagina, second trimester: Secondary | ICD-10-CM | POA: Diagnosis present

## 2020-01-07 DIAGNOSIS — Z7982 Long term (current) use of aspirin: Secondary | ICD-10-CM | POA: Insufficient documentation

## 2020-01-07 DIAGNOSIS — Z79899 Other long term (current) drug therapy: Secondary | ICD-10-CM | POA: Diagnosis not present

## 2020-01-07 DIAGNOSIS — Z87891 Personal history of nicotine dependence: Secondary | ICD-10-CM | POA: Diagnosis not present

## 2020-01-07 LAB — WET PREP, GENITAL
Clue Cells Wet Prep HPF POC: NONE SEEN
Sperm: NONE SEEN
Trich, Wet Prep: NONE SEEN
Yeast Wet Prep HPF POC: NONE SEEN

## 2020-01-07 LAB — URINALYSIS, ROUTINE W REFLEX MICROSCOPIC
Bilirubin Urine: NEGATIVE
Glucose, UA: 100 mg/dL — AB
Hgb urine dipstick: NEGATIVE
Ketones, ur: NEGATIVE mg/dL
Leukocytes,Ua: NEGATIVE
Nitrite: NEGATIVE
Protein, ur: NEGATIVE mg/dL
Specific Gravity, Urine: >1.03 — ABNORMAL HIGH (ref 1.005–1.030)
pH: 6 (ref 5.0–8.0)

## 2020-01-07 NOTE — ED Triage Notes (Signed)
Pt [redacted] weeks pregnant. Pt c/o increased vaginal discharge and mild cramping. Denies vaginal bleeding. Pt concerned because she had a miscarriage last year.

## 2020-01-07 NOTE — ED Provider Notes (Addendum)
[facility] EMERGENCY DEPARTMENT Provider Note   CSN: [id number] Arrival date & time: 01/07/20  1743     History Chief Complaint  Patient presents with  . Vaginal Discharge    Candace Cohen is a 39 y.o. female.  Patient is a 39 year old female who is approximately [redacted] weeks pregnant presenting to the emergency department for vaginal discharge.  Patient is regularly seeing OB/GYN and saw them 3 days ago and reports everything was normal.  Reports that for the last 48 hours she has felt like her panties have been more wet than usual.  She reports she has not been sexually active since November 2020.  She reports that normally she has vaginal discharge about the size of two quarters but that the last couple of days it is felt more wet.  Denies any burning with urination, nausea, vomiting.  Reports slight lower abdominal cramping which is not new.  No vaginal bleeding.        Past Medical History:  Diagnosis Date  . Irregular heart beat   . Obesity   . Thyroid disease     There are no problems to display for this patient.   Past Surgical History:  Procedure Laterality Date  . ANKLE FRACTURE SURGERY Left   . BALLOON ANGIOPLASTY, ARTERY      Age 61-   Coronary Artery per patient     OB History    Gravida  4   Para  2   Term  2   Preterm  0   AB  1   Living  2     SAB  1   TAB  0   Ectopic  0   Multiple  0   Live Births  0        Obstetric Comments  Vaginal delivery x 2 Hx preterm labor @34  weeks, delivered @ 37 weeks        No family history on file.  Social History   Tobacco Use  . Smoking status: Former  . Smokeless tobacco: Never Used  Substance Use Topics  . Alcohol use: No  . Drug use: No    Home Medications Prior to Admission medications   Medication Sig Start Date End Date Taking? Authorizing Provider  aspirin EC 81 MG tablet Take 81 mg by mouth daily.   Yes [provider]  omeprazole (PRILOSEC) 10  MG capsule Take 10 mg by mouth daily.   Yes [provider]  dexamethasone (DECADRON) 4 MG tablet Take 1 tablet (4 mg total) by mouth 2 (two) times daily with a meal. 08/01/15   08/03/15, PA-C  meloxicam (MOBIC) 7.5 MG tablet Take 1 tablet (7.5 mg total) by mouth 2 (two) times daily. 08/01/15   08/03/15, PA-C    Allergies    Patient has no known allergies.  Review of Systems   Review of Systems  Constitutional: Negative for appetite change, chills and fever.  Gastrointestinal: Negative for abdominal pain, nausea and vomiting.  Genitourinary: Positive for pelvic pain and vaginal discharge. Negative for decreased urine volume, difficulty urinating, dysuria, vaginal bleeding and vaginal pain.  Skin: Negative for rash.    Physical Exam Updated Vital Signs BP (!) 120/47 (BP Location: Right Arm)   Pulse (!) 103   Temp 99.5 F (37.5 C) (Oral)   Resp 20   Ht 5\' 5"  (1.651 m)   Wt 109.3 kg   LMP 02/09/2019 (Exact Date) Comment: [facility]  03/14/2019  SpO2 100%   BMI 40.10 kg/m   Physical Exam Vitals and nursing note reviewed. Exam conducted with a chaperone present.  Constitutional:      General: She is not in acute distress.    Appearance: Normal appearance. She is not ill-appearing, toxic-appearing or diaphoretic.  HENT:     Head: Normocephalic.  Eyes:     Conjunctiva/sclera: Conjunctivae normal.  Pulmonary:     Effort: Pulmonary effort is normal.  Genitourinary:    General: Normal vulva.     Vagina: Vaginal discharge (gray) present.     Cervix: Normal.     Adnexa: Right adnexa normal and left adnexa normal.  Skin:    General: Skin is dry.  Neurological:     Mental Status: She is alert.  Psychiatric:        Mood and Affect: Mood normal.     ED Results / Procedures / Treatments   Labs (all labs ordered are listed, but only abnormal results are displayed) Labs Reviewed  WET PREP, GENITAL - Abnormal; Notable for the  following components:      Result Value   WBC, Wet Prep HPF POC MANY (*)    All other components within normal limits  URINALYSIS, ROUTINE W REFLEX MICROSCOPIC - Abnormal; Notable for the following components:   Color, Urine AMBER (*)    Specific Gravity, Urine >1.030 (*)    Glucose, UA 100 (*)    All other components within normal limits  URINE CULTURE  HCG, QUANTITATIVE, PREGNANCY    EKG None  Radiology No results found.  Procedures Procedures (including critical care time)  Medications Ordered in ED Medications - No data to display  ED Course  I have reviewed the triage vital signs and the nursing notes.  Pertinent labs & imaging results that were available during my care of the patient were reviewed by me and considered in my medical decision making (see chart for details).  Clinical Course as of Jan 06 2242  Mon Jan 07, 2020  2107 24-week pregnant patient presenting with vaginal discharge for the last 48 hours.  Wet prep and urinalysis reassuring.  Reassuring vaginal exam with closed cervix.  Discussed with OB/GYN at [facility] and they recommend fetal Doppler heart tones.  Otherwise patient can f/u with obgyn.  When I went to update the patient on our plan she had eloped from the emergency department.   [KM]    Clinical Course User Index [KM] Kristine Royal   MDM Rules/Calculators/A&P                      Based on review of vitals, medical screening exam, lab work and/or imaging, there does not appear to be an acute, emergent etiology for the patient's symptoms. Counseled pt on good return precautions and encouraged both PCP and ED follow-up as needed.  Prior to discharge, I also discussed incidental imaging findings with patient in detail and advised appropriate, recommended follow-up in detail.  Clinical Impression: 1. Vaginal discharge     Disposition: Discharge  Prior to providing a prescription for a controlled substance, I independently reviewed the  patient's recent prescription history on the [place]. The patient had no recent or regular prescriptions and was deemed appropriate for a brief, less than 3 day prescription of narcotic for acute analgesia.  This note was prepared with assistance of Systems analyst. Occasional wrong-word or sound-a-like substitutions may have occurred due to  the inherent limitations of voice recognition software.  Final Clinical Impression(s) / ED Diagnoses Final diagnoses:  Vaginal discharge    Rx / DC Orders ED Discharge Orders    None       Jeral Pinch 01/07/20 2109    Arlyn Dunning, PA-C 01/07/20 2243    Vanetta Mulders, MD 01/09/20 [phone]

## 2020-01-07 NOTE — Discharge Instructions (Addendum)
You are seen today for vaginal discharge.  Your work-up was reassuring including your fetal heart tones, wet prep and urinalysis.  We spoke with the OB/GYN specialist over at [facility] and they agree with your plan to be discharged and follow-up with your regular OB/GYN doctor tomorrow.

## 2020-01-07 NOTE — ED Notes (Signed)
Called [facility] and spoke with Baxter Hire, Charity fundraiser. She advised fetal heart tones with a doppler is all that is needed. She did not advise to place pt on TOCO monitoring at this time.

## 2020-01-07 NOTE — Progress Notes (Addendum)
OB RR RN called regarding G4P2 at [redacted]w[redacted]d presenting with gray colored discharge and mild cramping, pt denies any vaginal bleeding. Cvx closed per EDP.  FHR 144 by doppler by primary RN.  Pt is being followed by Deeann Cree, will notify faculty attending of patient.  Addendum 2034: Spoke with Dr. Debroah Loop, fine with FHR doppler w/o toco, fetal wellbeing established.  Pt is OB cleared.

## 2020-01-09 LAB — URINE CULTURE
